# Patient Record
Sex: Male | Born: 1975 | Race: White | Hispanic: No | Marital: Married | Smoking: Current every day smoker
Health system: Southern US, Community
[De-identification: ages and names within clinical notes are randomized; demographics above are authoritative.]

## PROBLEM LIST (undated history)

## (undated) DIAGNOSIS — N200 Calculus of kidney: Secondary | ICD-10-CM

## (undated) DIAGNOSIS — I10 Essential (primary) hypertension: Secondary | ICD-10-CM

## (undated) HISTORY — PX: LITHOTRIPSY: SUR834

## (undated) HISTORY — PX: VASECTOMY: SHX75

---

## 2006-10-01 ENCOUNTER — Emergency Department (HOSPITAL_COMMUNITY): Admission: EM | Admit: 2006-10-01 | Discharge: 2006-10-01 | Payer: Self-pay | Admitting: *Deleted

## 2006-10-13 ENCOUNTER — Ambulatory Visit (HOSPITAL_COMMUNITY): Admission: RE | Admit: 2006-10-13 | Discharge: 2006-10-13 | Payer: Self-pay | Admitting: Urology

## 2007-10-09 ENCOUNTER — Ambulatory Visit (HOSPITAL_COMMUNITY): Admission: RE | Admit: 2007-10-09 | Discharge: 2007-10-09 | Payer: Self-pay | Admitting: Urology

## 2007-12-02 ENCOUNTER — Emergency Department (HOSPITAL_COMMUNITY): Admission: EM | Admit: 2007-12-02 | Discharge: 2007-12-02 | Payer: Self-pay | Admitting: Emergency Medicine

## 2010-09-29 ENCOUNTER — Emergency Department (HOSPITAL_BASED_OUTPATIENT_CLINIC_OR_DEPARTMENT_OTHER)
Admission: EM | Admit: 2010-09-29 | Discharge: 2010-09-29 | Disposition: A | Discharge status: Home / Self Care | Payer: Self-pay | Attending: Emergency Medicine | Admitting: Emergency Medicine

## 2010-09-29 ENCOUNTER — Emergency Department (INDEPENDENT_AMBULATORY_CARE_PROVIDER_SITE_OTHER): Payer: Self-pay

## 2010-09-29 DIAGNOSIS — R110 Nausea: Secondary | ICD-10-CM

## 2010-09-29 DIAGNOSIS — Z87442 Personal history of urinary calculi: Secondary | ICD-10-CM

## 2010-09-29 DIAGNOSIS — F17200 Nicotine dependence, unspecified, uncomplicated: Secondary | ICD-10-CM | POA: Insufficient documentation

## 2010-09-29 DIAGNOSIS — R109 Unspecified abdominal pain: Secondary | ICD-10-CM | POA: Insufficient documentation

## 2010-09-29 DIAGNOSIS — R300 Dysuria: Secondary | ICD-10-CM | POA: Insufficient documentation

## 2010-09-29 LAB — URINALYSIS, ROUTINE W REFLEX MICROSCOPIC
Bilirubin Urine: NEGATIVE
Ketones, ur: NEGATIVE mg/dL
Nitrite: NEGATIVE
Urine Glucose, Fasting: NEGATIVE mg/dL
pH: 6.5 (ref 5.0–8.0)

## 2011-01-02 ENCOUNTER — Emergency Department (HOSPITAL_BASED_OUTPATIENT_CLINIC_OR_DEPARTMENT_OTHER)
Admission: EM | Admit: 2011-01-02 | Discharge: 2011-01-02 | Disposition: A | Discharge status: Home / Self Care | Payer: Self-pay | Attending: Emergency Medicine | Admitting: Emergency Medicine

## 2011-01-02 DIAGNOSIS — F17200 Nicotine dependence, unspecified, uncomplicated: Secondary | ICD-10-CM | POA: Insufficient documentation

## 2011-01-02 DIAGNOSIS — I10 Essential (primary) hypertension: Secondary | ICD-10-CM | POA: Insufficient documentation

## 2011-01-02 DIAGNOSIS — R51 Headache: Secondary | ICD-10-CM | POA: Insufficient documentation

## 2011-01-03 ENCOUNTER — Emergency Department (INDEPENDENT_AMBULATORY_CARE_PROVIDER_SITE_OTHER): Payer: Self-pay

## 2011-01-03 DIAGNOSIS — J3489 Other specified disorders of nose and nasal sinuses: Secondary | ICD-10-CM

## 2011-01-03 DIAGNOSIS — I10 Essential (primary) hypertension: Secondary | ICD-10-CM

## 2011-01-03 DIAGNOSIS — R51 Headache: Secondary | ICD-10-CM

## 2011-01-03 LAB — BASIC METABOLIC PANEL
BUN: 13 mg/dL (ref 6–23)
CO2: 29 mEq/L (ref 19–32)
Calcium: 9.4 mg/dL (ref 8.4–10.5)
Chloride: 103 mEq/L (ref 96–112)
Creatinine, Ser: 1.2 mg/dL (ref 0.4–1.5)
GFR calc non Af Amer: >60 mL/min (ref >60)
Glucose, Bld: 120 mg/dL — ABNORMAL HIGH (ref 70–99)
Sodium: 142 mEq/L (ref 135–145)

## 2013-11-16 ENCOUNTER — Encounter (HOSPITAL_BASED_OUTPATIENT_CLINIC_OR_DEPARTMENT_OTHER): Payer: Self-pay | Admitting: Emergency Medicine

## 2013-11-16 ENCOUNTER — Emergency Department (HOSPITAL_BASED_OUTPATIENT_CLINIC_OR_DEPARTMENT_OTHER): Payer: BC Managed Care – PPO

## 2013-11-16 ENCOUNTER — Emergency Department (HOSPITAL_BASED_OUTPATIENT_CLINIC_OR_DEPARTMENT_OTHER)
Admission: EM | Admit: 2013-11-16 | Discharge: 2013-11-16 | Disposition: A | Discharge status: Home / Self Care | Payer: BC Managed Care – PPO | Attending: Emergency Medicine | Admitting: Emergency Medicine

## 2013-11-16 DIAGNOSIS — Z87442 Personal history of urinary calculi: Secondary | ICD-10-CM | POA: Insufficient documentation

## 2013-11-16 DIAGNOSIS — F17200 Nicotine dependence, unspecified, uncomplicated: Secondary | ICD-10-CM | POA: Insufficient documentation

## 2013-11-16 DIAGNOSIS — R34 Anuria and oliguria: Secondary | ICD-10-CM | POA: Insufficient documentation

## 2013-11-16 DIAGNOSIS — R361 Hematospermia: Secondary | ICD-10-CM | POA: Insufficient documentation

## 2013-11-16 DIAGNOSIS — M545 Low back pain, unspecified: Secondary | ICD-10-CM | POA: Insufficient documentation

## 2013-11-16 DIAGNOSIS — I10 Essential (primary) hypertension: Secondary | ICD-10-CM | POA: Insufficient documentation

## 2013-11-16 DIAGNOSIS — R319 Hematuria, unspecified: Secondary | ICD-10-CM | POA: Insufficient documentation

## 2013-11-16 DIAGNOSIS — Z79899 Other long term (current) drug therapy: Secondary | ICD-10-CM | POA: Insufficient documentation

## 2013-11-16 HISTORY — DX: Calculus of kidney: N20.0

## 2013-11-16 HISTORY — DX: Essential (primary) hypertension: I10

## 2013-11-16 LAB — URINALYSIS, ROUTINE W REFLEX MICROSCOPIC
Bilirubin Urine: NEGATIVE
Glucose, UA: NEGATIVE mg/dL
Hgb urine dipstick: NEGATIVE
KETONES UR: NEGATIVE mg/dL
LEUKOCYTES UA: NEGATIVE
NITRITE: NEGATIVE
PROTEIN: NEGATIVE mg/dL
Specific Gravity, Urine: 1.019 (ref 1.005–1.030)
UROBILINOGEN UA: 1 mg/dL (ref 0.0–1.0)
pH: 7 (ref 5.0–8.0)

## 2013-11-16 LAB — CBC
HEMATOCRIT: 45.9 % (ref 39.0–52.0)
Hemoglobin: 15.8 g/dL (ref 13.0–17.0)
MCH: 29.4 pg (ref 26.0–34.0)
MCHC: 34.4 g/dL (ref 30.0–36.0)
MCV: 85.3 fL (ref 78.0–100.0)
Platelets: 268 10*3/uL (ref 150–400)
RBC: 5.38 MIL/uL (ref 4.22–5.81)
RDW: 14 % (ref 11.5–15.5)
WBC: 10.6 10*3/uL — ABNORMAL HIGH (ref 4.0–10.5)

## 2013-11-16 LAB — BASIC METABOLIC PANEL
BUN: 11 mg/dL (ref 6–23)
CHLORIDE: 101 meq/L (ref 96–112)
CO2: 27 meq/L (ref 19–32)
CREATININE: 1 mg/dL (ref 0.50–1.35)
Calcium: 9.7 mg/dL (ref 8.4–10.5)
GFR calc Af Amer: >90 mL/min (ref >90)
GLUCOSE: 136 mg/dL — AB (ref 70–99)
POTASSIUM: 3.8 meq/L (ref 3.7–5.3)
SODIUM: 141 meq/L (ref 137–147)

## 2013-11-16 MED ORDER — HYDROCODONE-ACETAMINOPHEN 5-325 MG PO TABS: 1.0000 | ORAL_TABLET | Freq: Four times a day (QID) | ORAL | Status: AC | PRN

## 2013-11-16 MED ORDER — IBUPROFEN 800 MG PO TABS: 800.0000 mg | ORAL_TABLET | Freq: Three times a day (TID) | ORAL | Status: AC

## 2013-11-16 MED ORDER — MORPHINE SULFATE 4 MG/ML IJ SOLN
4.0000 mg | Freq: Once | INTRAMUSCULAR | Status: AC
  Administered 2013-11-16: 4 mg via INTRAVENOUS
  Filled 2013-11-16: qty 1

## 2013-11-16 MED ORDER — CYCLOBENZAPRINE HCL 10 MG PO TABS
10.0000 mg | ORAL_TABLET | Freq: Two times a day (BID) | ORAL | Status: AC | PRN
Start: 2013-11-16 — End: ?

## 2013-11-16 NOTE — ED Notes (Signed)
MD at bedside. 

## 2013-11-16 NOTE — Discharge Instructions (Signed)
Back Pain, Adult Low back pain is very common. About 1 in 5 people have back pain.The cause of low back pain is rarely dangerous. The pain often gets better over time.About half of people with a sudden onset of back pain feel better in just 2 weeks. About 8 in 10 people feel better by 6 weeks.  CAUSES Some common causes of back pain include:  Strain of the muscles or ligaments supporting the spine.  Wear and tear (degeneration) of the spinal discs.  Arthritis.  Direct injury to the back. DIAGNOSIS Most of the time, the direct cause of low back pain is not known.However, back pain can be treated effectively even when the exact cause of the pain is unknown.Answering your caregiver's questions about your overall health and symptoms is one of the most accurate ways to make sure the cause of your pain is not dangerous. If your caregiver needs more information, he or she may order lab work or imaging tests (X-rays or MRIs).However, even if imaging tests show changes in your back, this usually does not require surgery. HOME CARE INSTRUCTIONS For many people, back pain returns.Since low back pain is rarely dangerous, it is often a condition that people can learn to manageon their own.   Remain active. It is stressful on the back to sit or stand in one place. Do not sit, drive, or stand in one place for more than 30 minutes at a time. Take short walks on level surfaces as soon as pain allows.Try to increase the length of time you walk each day.  Do not stay in bed.Resting more than 1 or 2 days can delay your recovery.  Do not avoid exercise or work.Your body is made to move.It is not dangerous to be active, even though your back may hurt.Your back will likely heal faster if you return to being active before your pain is gone.  Pay attention to your body when you bend and lift. Many people have less discomfortwhen lifting if they bend their knees, keep the load close to their bodies,and  avoid twisting. Often, the most comfortable positions are those that put less stress on your recovering back.  Find a comfortable position to sleep. Use a firm mattress and lie on your side with your knees slightly bent. If you lie on your back, put a pillow under your knees.  Only take over-the-counter or prescription medicines as directed by your caregiver. Over-the-counter medicines to reduce pain and inflammation are often the most helpful.Your caregiver may prescribe muscle relaxant drugs.These medicines help dull your pain so you can more quickly return to your normal activities and healthy exercise.  Put ice on the injured area.  Put ice in a plastic bag.  Place a towel between your skin and the bag.  Leave the ice on for 15-20 minutes, 03-04 times a day for the first 2 to 3 days. After that, ice and heat may be alternated to reduce pain and spasms.  Ask your caregiver about trying back exercises and gentle massage. This may be of some benefit.  Avoid feeling anxious or stressed.Stress increases muscle tension and can worsen back pain.It is important to recognize when you are anxious or stressed and learn ways to manage it.Exercise is a great option. SEEK MEDICAL CARE IF:  You have pain that is not relieved with rest or medicine.  You have pain that does not improve in 1 week.  You have new symptoms.  You are generally not feeling well. SEEK   IMMEDIATE MEDICAL CARE IF:   You have pain that radiates from your back into your legs.  You develop new bowel or bladder control problems.  You have unusual weakness or numbness in your arms or legs.  You develop nausea or vomiting.  You develop abdominal pain.  You feel faint. Document Released: 07/19/2005 Document Revised: 01/18/2012 Document Reviewed: 12/07/2010 [employer] Patient Information 2014 [employer].  

## 2013-11-16 NOTE — ED Notes (Signed)
Patient transported to CT 

## 2013-11-16 NOTE — ED Notes (Signed)
Intermittent blood in urine x2 weeks.  Left flank pain x2 hours.  Reports about 2 kidney stones per year.

## 2013-11-16 NOTE — ED Provider Notes (Signed)
CSN: [id number]     Arrival date & time 11/16/13  1818 History  This chart was scribed for Dagmar HaitWilliam Maleyah Evans, MD by Dorothey Basemania Sutton, ED Scribe. This patient was seen in room MH03/MH03 and the patient's care was started at 6:36 PM.    Chief Complaint  Patient presents with  . Flank Pain   Patient is a 38 y.o. male presenting with flank pain. The history is provided by the patient. No language interpreter was used.  Flank Pain This is a recurrent problem. The current episode started 1 to 2 hours ago. The problem occurs constantly. The problem has not changed since onset.Nothing aggravates the symptoms. The symptoms are relieved by walking. He has tried nothing for the symptoms.   HPI Comments: Rosita FireJody Carducci is a 38 y.o. Male with a history of nephrolithiasis (approximately two episodes per year) with lithotripsy x2 who presents to the Emergency Department complaining of a constant, non-radiating pain to the left flank onset 2 hours ago with associated, intermittent hematuria and decreased urine volume for the past 2 weeks. He states that the pain is alleviated with standing and walking. Patient reports that his current symptoms feel similar to his prior kidney stones. He denies nausea, vomiting, fever, dysuria, testicular pain. Patient also has a history of HTN.   Past Medical History  Diagnosis Date  . Kidney stones   . Hypertension    Past Surgical History  Procedure Laterality Date  . Vasectomy    . Lithotripsy     No family history on file. History  Substance Use Topics  . Smoking status: Current Every Day Smoker -- 1.00 packs/day  . Smokeless tobacco: Never Used  . Alcohol Use: No    Review of Systems  Constitutional: Negative for fever.  Gastrointestinal: Negative for nausea and vomiting.  Genitourinary: Positive for hematuria, flank pain and decreased urine volume. Negative for dysuria and testicular pain.  All other systems reviewed and are negative.  Allergies  Review of  patient's allergies indicates no known allergies.  Home Medications   Prior to Admission medications   Medication Sig Start Date End Date Taking? Authorizing Provider  amLODipine (NORVASC) 10 MG tablet Take 10 mg by mouth daily.   Yes Historical Provider, MD  lisinopril (PRINIVIL,ZESTRIL) 40 MG tablet Take 40 mg by mouth daily.   Yes Historical Provider, MD   Triage Vitals: BP 186/107  Pulse 96  Temp(Src) 99 F (37.2 C) (Oral)  Resp 16  Ht 5\' 11"  (1.803 m)  Wt 308 lb (139.708 kg)  BMI 42.98 kg/m2  SpO2 97%  Physical Exam  Nursing note and vitals reviewed. Constitutional: He is oriented to person, place, and time. He appears well-developed and well-nourished. No distress.  HENT:  Head: Normocephalic and atraumatic.  Eyes: Conjunctivae are normal.  Neck: Normal range of motion. Neck supple.  Cardiovascular: Normal rate, regular rhythm and normal heart sounds.   Pulmonary/Chest: Effort normal and breath sounds normal. No respiratory distress.  Abdominal: Soft. He exhibits no distension. There is no tenderness. There is no rebound and no guarding.  No left flank tenderness to palpation.   Genitourinary:  Chaperone (scribe) was present for exam which was performed with no discomfort or complications.   No testicular tenderness or scrotal swelling. No inguinal hernia.   Musculoskeletal: Normal range of motion.  Neurological: He is alert and oriented to person, place, and time.  Skin: Skin is warm and dry.  Psychiatric: He has a normal mood and affect. His behavior is  normal.    ED Course  Procedures (including critical care time)  DIAGNOSTIC STUDIES: Oxygen Saturation is 97% on room air, normal by my interpretation.    COORDINATION OF CARE: 6:28 PM- Ordered UA.  6:40 PM- Will order a CT of the abdomen, CBC, and BMP. Will order morphine to manage symptoms. Discussed treatment plan with patient at bedside and patient verbalized agreement.     Labs Review Labs Reviewed   URINALYSIS, ROUTINE W REFLEX MICROSCOPIC - Abnormal; Notable for the following:    APPearance CLOUDY (*)    All other components within normal limits  CBC - Abnormal; Notable for the following:    WBC 10.6 (*)    All other components within normal limits  BASIC METABOLIC PANEL - Abnormal; Notable for the following:    Glucose, Bld 136 (*)    All other components within normal limits    Imaging Review Ct Abdomen Pelvis Wo Contrast  11/16/2013   CLINICAL DATA:  Left-sided flank pain.  History of kidney stones.  EXAM: CT ABDOMEN AND PELVIS WITHOUT CONTRAST  TECHNIQUE: Multidetector CT imaging of the abdomen and pelvis was performed following the standard protocol without intravenous contrast.  COMPARISON:  CT of the abdomen and pelvis 09/29/2010.  FINDINGS: Lung Bases: Unremarkable.  Abdomen/Pelvis: No abnormal calcifications are identified within the collecting system of either kidney, along the course of either ureter, or within the lumen of the urinary bladder. No hydroureteronephrosis or perinephric stranding to suggest urinary tract obstruction at this time. There are 2 tiny high attenuation lesions in the left kidney, largest of which extends off the posterior aspect of the interpolar region of the left kidney measuring 1.5 cm; these are incompletely characterized, but are favored to represent small proteinaceous or hemorrhagic cysts, and the larger of these lesions is actually smaller than noted on the prior study and now demonstrates high attenuation (likely a involuting hemorrhagic cyst).  Diffuse decreased attenuation throughout the hepatic parenchyma, with exception of the region surrounding the gallbladder fossa and portions of the posterior aspect of segments 2, 3 and 4 of the liver (likely reflecting focal fatty sparing). No other focal hepatic lesions are identified on today's non contrast CT examination. The unenhanced appearance of the gallbladder, pancreas, spleen and bilateral adrenal  glands is unremarkable. Normal appendix (retrocecal in position). No significant volume of ascites. No pneumoperitoneum. No pathologic distention of small bowel. No definite lymphadenopathy identified within the abdomen or pelvis on today's non contrast CT examination. Atherosclerosis throughout the abdominal and pelvic vasculature, without evidence of aneurysm. Prostate gland and urinary bladder are unremarkable in appearance.  Musculoskeletal: There are no aggressive appearing lytic or blastic lesions noted in the visualized portions of the skeleton.  IMPRESSION: 1. No acute findings in the abdomen or pelvis to account for the patient's symptoms. Specifically, no abnormal urinary tract calculi or findings of urinary tract obstruction are noted at this time. 2. Normal appendix. 3. Hepatic steatosis. 4. 2 small high attenuation lesions in the left kidney favored to represent small hemorrhagic or proteinaceous cysts, as above.   Electronically Signed   By: Trudie Reed M.D.   On: 11/16/2013 19:22     EKG Interpretation None      MDM   Final diagnoses:  Lower back pain     44M with hx of kidney stones requiring lithotripsy presents with left lower back pain. Feels like prior kidney stones. No injury. Having occasional hematuria, hematospermia. None today. No fevers, vomiting. On exam, abdomen benign.  Left lower back muscle spasm, tenderness. Scan negative for stones, likely musculoskeletal back pain. Instructed to f/u with PCP for hematuria, hematospermia. Did not want a rectal exam today to look for prostatitis, not having perineal pain.   I personally performed the services described in this documentation, which was scribed in my presence. The recorded information has been reviewed and is accurate.      Dagmar HaitWilliam Nethra Mehlberg, MD 11/17/13 [facility] [email or web address]

## 2015-01-23 IMAGING — CT CT ABD-PELV W/O CM
2 of 4 series · 16 of 46 positions shown, 18 images · non-contrast
Comparison: CT of the abdomen and pelvis 09/29/2010.

CLINICAL DATA: Left-sided flank pain.  History of kidney stones.

EXAM:
CT ABDOMEN AND PELVIS WITHOUT CONTRAST
TECHNIQUE: Multidetector CT imaging of the abdomen and pelvis was performed
following the standard protocol without intravenous contrast.

[Series 2: renal stone > 200 lbs 5.0 b31f · axial · 0.90mm/px · z∈[-530,-80]mm · 13 of 100 slices shown, 15 images]
[im 5/100  soft-tissue]
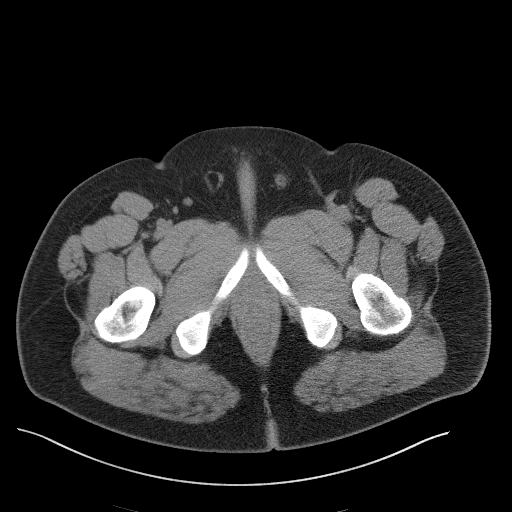
[im 5/100  bone]
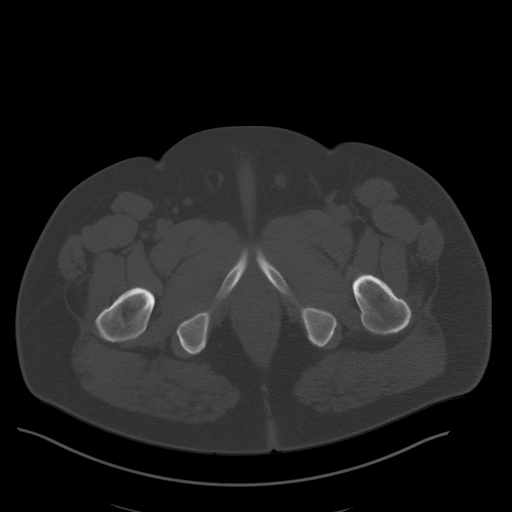
[im 13/100  soft-tissue]
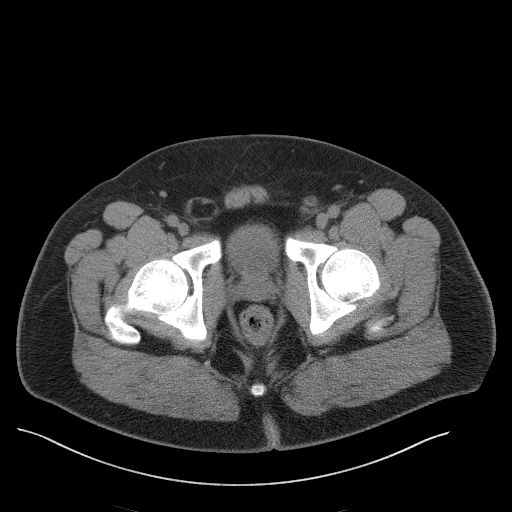
[im 22/100  soft-tissue]
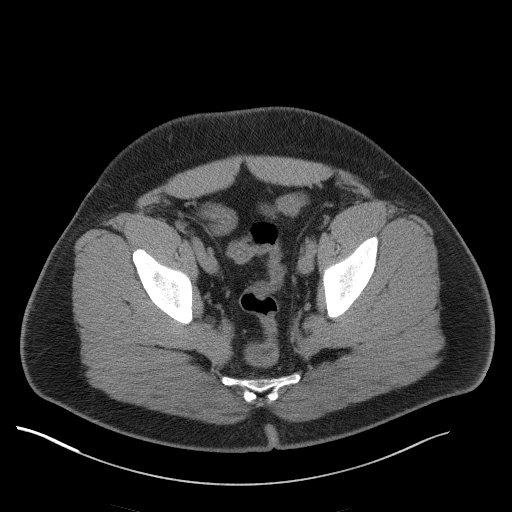
[im 26/100  soft-tissue]
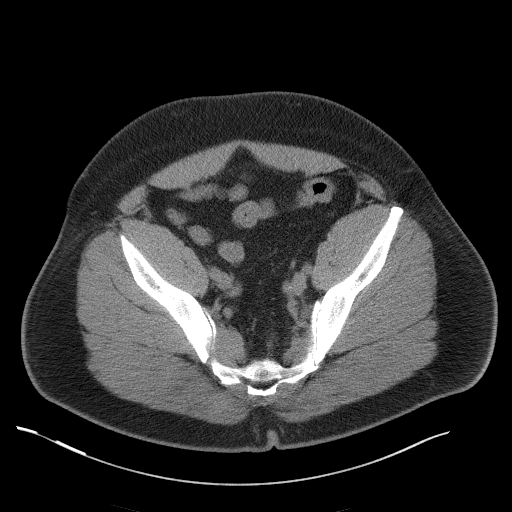
[im 35/100  soft-tissue]
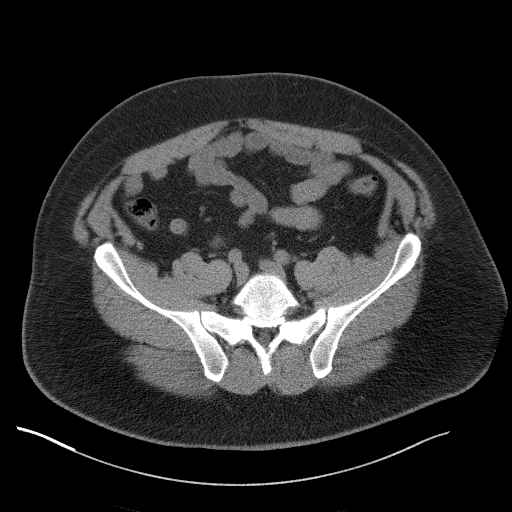
[im 44/100  soft-tissue]
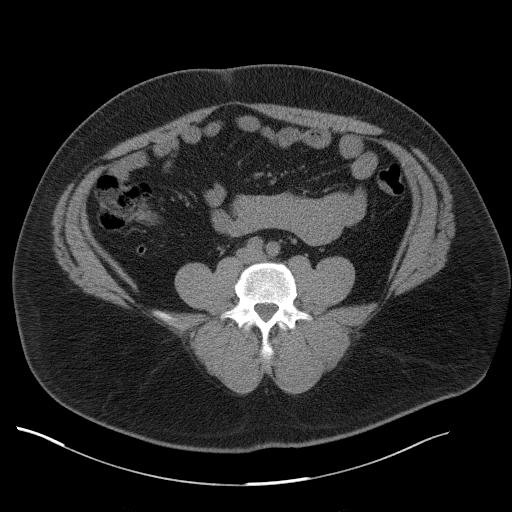
[im 52/100  soft-tissue]
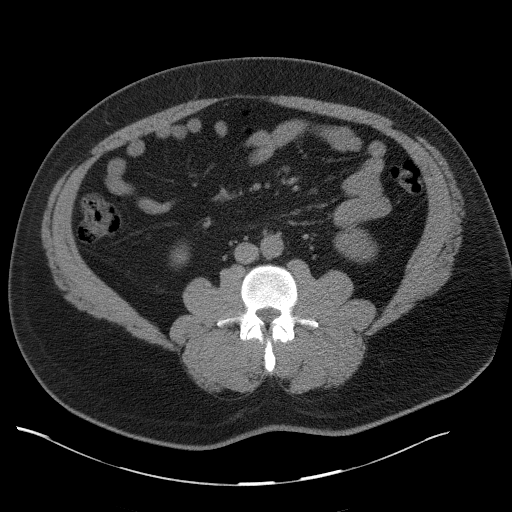
[im 56/100  soft-tissue]
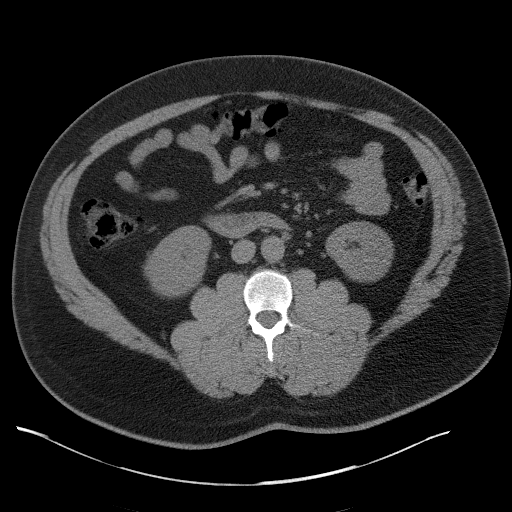
[im 65/100  soft-tissue]
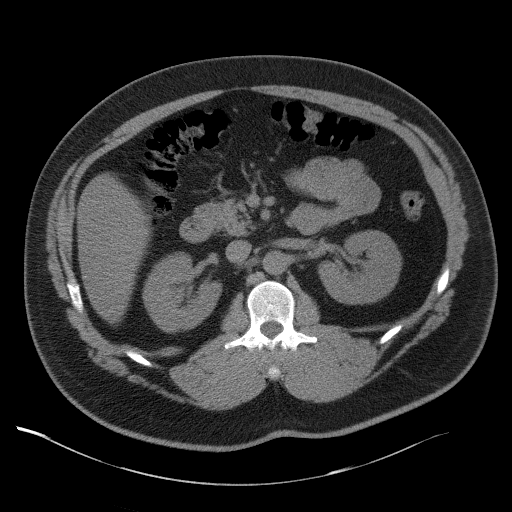
[im 65/100  bone]
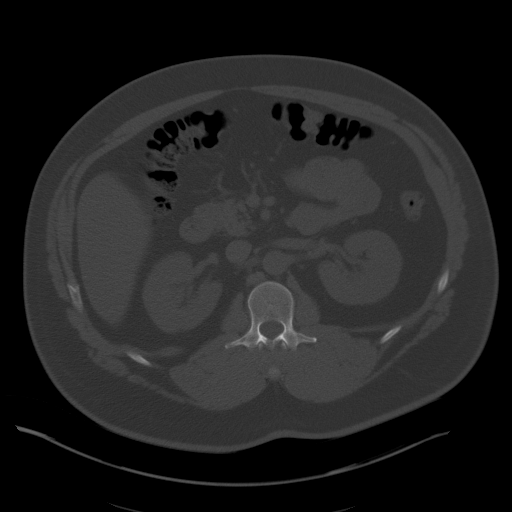
[im 74/100  soft-tissue]
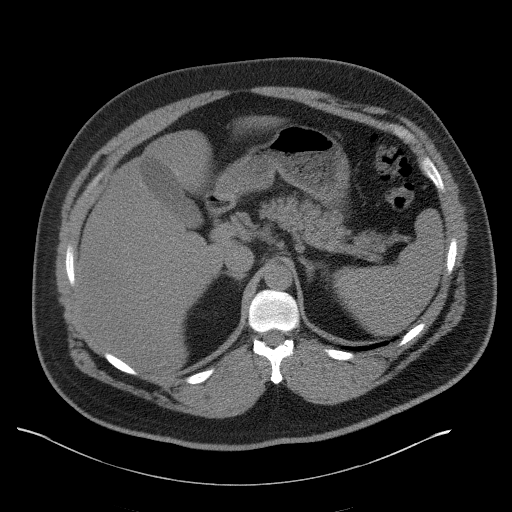
[im 78/100  soft-tissue]
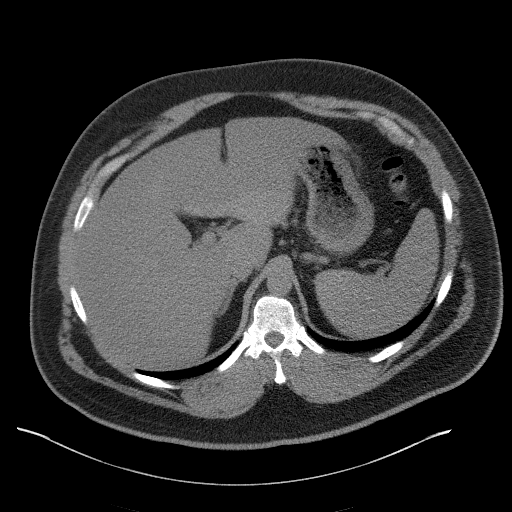
[im 87/100  soft-tissue]
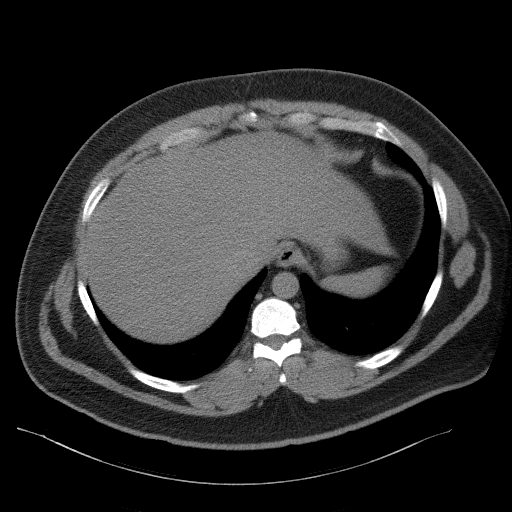
[im 95/100  soft-tissue]
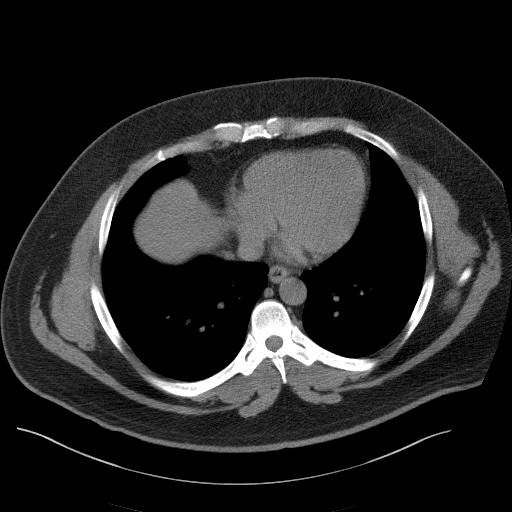

[Series 5: renal stone 3.0 coronal · coronal · 0.96mm/px · 3 of 101 slices shown]
[im 34/101  soft-tissue]
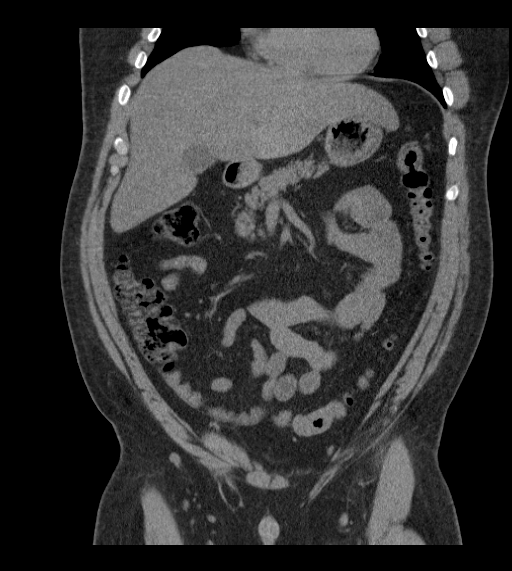
[im 45/101  soft-tissue]
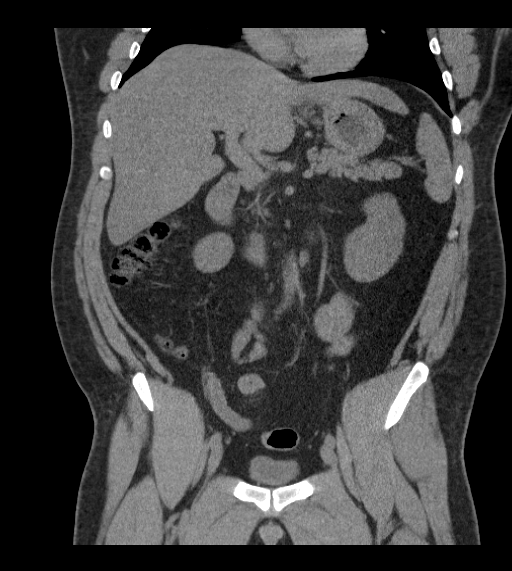
[im 56/101  soft-tissue]
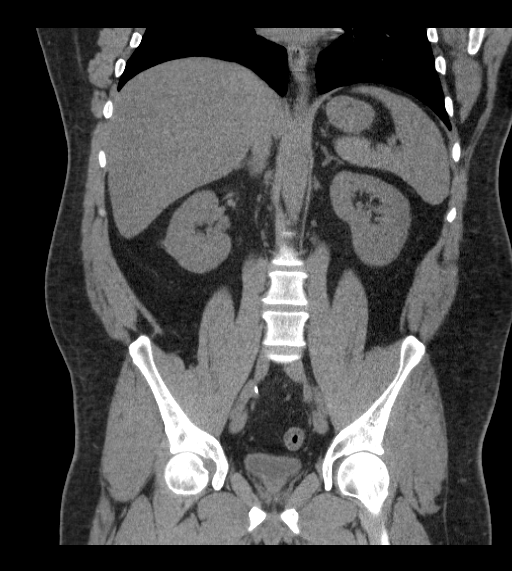

[16 of 46 positions shown; findings below may reference images not displayed]

FINDINGS: Lung Bases: Unremarkable.

Abdomen/Pelvis: No abnormal calcifications are identified within the
collecting system of either kidney, along the course of either
ureter, or within the lumen of the urinary bladder. No
hydroureteronephrosis or perinephric stranding to suggest urinary
tract obstruction at this time. There are 2 tiny high attenuation
lesions in the left kidney, largest of which extends off the
posterior aspect of the interpolar region of the left kidney
measuring 1.5 cm; these are incompletely characterized, but are
favored to represent small proteinaceous or hemorrhagic cysts, and
the larger of these lesions is actually smaller than noted on the
prior study and now demonstrates high attenuation (likely a
involuting hemorrhagic cyst).

Diffuse decreased attenuation throughout the hepatic parenchyma,
with exception of the region surrounding the gallbladder fossa and
portions of the posterior aspect of segments 2, 3 and 4 of the liver
(likely reflecting focal fatty sparing). No other focal hepatic
lesions are identified on today's non contrast CT examination. The
unenhanced appearance of the gallbladder, pancreas, spleen and
bilateral adrenal glands is unremarkable. Normal appendix
(retrocecal in position). No significant volume of ascites. No
pneumoperitoneum. No pathologic distention of small bowel. No
definite lymphadenopathy identified within the abdomen or pelvis on
today's non contrast CT examination. Atherosclerosis throughout the
abdominal and pelvic vasculature, without evidence of aneurysm.
Prostate gland and urinary bladder are unremarkable in appearance.

Musculoskeletal: There are no aggressive appearing lytic or blastic
lesions noted in the visualized portions of the skeleton.
IMPRESSION: 1. No acute findings in the abdomen or pelvis to account for the
patient's symptoms. Specifically, no abnormal urinary tract calculi
or findings of urinary tract obstruction are noted at this time.
2. Normal appendix.
3. Hepatic steatosis.
4. 2 small high attenuation lesions in the left kidney favored to
represent small hemorrhagic or proteinaceous cysts, as above.
